# Patient Record
Sex: Female | Born: 1955 | Race: White | Hispanic: No | Marital: Married | Smoking: Never smoker
Health system: Southern US, Community
[De-identification: ages and names within clinical notes are randomized; demographics above are authoritative.]

## PROBLEM LIST (undated history)

## (undated) DIAGNOSIS — E039 Hypothyroidism, unspecified: Secondary | ICD-10-CM

## (undated) HISTORY — PX: CHOLECYSTECTOMY: SHX55

## (undated) HISTORY — DX: Hypothyroidism, unspecified: E03.9

---

## 1998-08-03 ENCOUNTER — Other Ambulatory Visit: Admission: RE | Admit: 1998-08-03 | Discharge: 1998-08-03 | Payer: Self-pay | Admitting: Obstetrics and Gynecology

## 1999-10-08 ENCOUNTER — Other Ambulatory Visit: Admission: RE | Admit: 1999-10-08 | Discharge: 1999-10-08 | Payer: Self-pay | Admitting: Obstetrics and Gynecology

## 2000-10-05 ENCOUNTER — Other Ambulatory Visit: Admission: RE | Admit: 2000-10-05 | Discharge: 2000-10-05 | Payer: Self-pay | Admitting: Obstetrics and Gynecology

## 2001-10-09 ENCOUNTER — Other Ambulatory Visit: Admission: RE | Admit: 2001-10-09 | Discharge: 2001-10-09 | Payer: Self-pay | Admitting: Obstetrics and Gynecology

## 2002-10-11 ENCOUNTER — Other Ambulatory Visit: Admission: RE | Admit: 2002-10-11 | Discharge: 2002-10-11 | Payer: Self-pay | Admitting: Obstetrics and Gynecology

## 2002-10-18 ENCOUNTER — Encounter: Payer: Self-pay | Admitting: Obstetrics and Gynecology

## 2002-10-18 ENCOUNTER — Encounter: Admission: RE | Admit: 2002-10-18 | Discharge: 2002-10-18 | Payer: Self-pay | Admitting: Obstetrics and Gynecology

## 2003-11-07 ENCOUNTER — Other Ambulatory Visit: Admission: RE | Admit: 2003-11-07 | Discharge: 2003-11-07 | Payer: Self-pay | Admitting: Obstetrics and Gynecology

## 2004-11-09 ENCOUNTER — Other Ambulatory Visit: Admission: RE | Admit: 2004-11-09 | Discharge: 2004-11-09 | Payer: Self-pay | Admitting: Obstetrics and Gynecology

## 2006-01-31 ENCOUNTER — Ambulatory Visit: Payer: Self-pay | Admitting: Internal Medicine

## 2015-03-09 ENCOUNTER — Telehealth: Payer: Self-pay | Admitting: Cardiology

## 2015-03-09 NOTE — Telephone Encounter (Signed)
Received records from [facility] for appointment with Dr Antoine PocheHochrein on 03/26/15.  [facility] for Dr Hochrein's schedule on 03/26/15, lp

## 2015-03-23 ENCOUNTER — Ambulatory Visit: Payer: Self-pay | Admitting: Cardiology

## 2015-03-26 ENCOUNTER — Encounter: Payer: Self-pay | Admitting: Cardiology

## 2015-03-26 ENCOUNTER — Ambulatory Visit (INDEPENDENT_AMBULATORY_CARE_PROVIDER_SITE_OTHER): Payer: BLUE CROSS/BLUE SHIELD | Admitting: Cardiology

## 2015-03-26 ENCOUNTER — Telehealth: Payer: Self-pay | Admitting: Cardiology

## 2015-03-26 VITALS — BP 126/84 | HR 80 | Ht 63.0 in | Wt 166.1 lb

## 2015-03-26 DIAGNOSIS — I4940 Unspecified premature depolarization: Secondary | ICD-10-CM | POA: Diagnosis not present

## 2015-03-26 DIAGNOSIS — R002 Palpitations: Secondary | ICD-10-CM

## 2015-03-26 NOTE — Patient Instructions (Signed)
Your physician recommends that you schedule a follow-up appointment in: As Needed  Your physician has recommended that you wear an event monitor. Event monitors are medical devices that record the heart's electrical activity. Doctors most often us these monitors to diagnose arrhythmias. Arrhythmias are problems with the speed or rhythm of the heartbeat. The monitor is a small, portable device. You can wear one while you do your normal daily activities. This is usually used to diagnose what is causing palpitations/syncope (passing out).  Merry Christmas and Happy New Year!!

## 2015-03-26 NOTE — Progress Notes (Signed)
Cardiology Office Note   Date:  03/26/2015   ID:  Cheryl Haverammie Ector, DOB 12/26/1955, MRN [id number]  PCP:  No primary care provider on file.  Cardiologist:   Rollene RotundaJames Malaia Buchta, MD  Referring:  Dr. Henderson Cloudomblin  Chief Complaint  Patient presents with  . Palpitations      History of Present Illness: Cheryl Moss is a 59 y.o. female who presents for evaluation of palpitations. She's had these have been happening for some time. It might happen 2 or 3 times per week. Feel a sensation of anxiety and breathing quickly. This happens at rest. She cannot bring it on. She does some walking at lunchtime at work and doesn't bring this on. She calms down passes spontaneously after several minutes. She's not had any presyncope or syncope. She's not describing any chest pressure, neck or arm discomfort. She otherwise doesn't have shortness of breath, PND or orthopnea. She occasionally gets some skipped isolated beats but these are rare and have been ongoing for years. She's never had any prior cardiac workup. She does have a family history of early onset coronary disease.  Past Medical History  Diagnosis Date  . Hypothyroid     Past Surgical History  Procedure Laterality Date  . Cholecystectomy       Current Outpatient Prescriptions  Medication Sig Dispense Refill  . levothyroxine (SYNTHROID, LEVOTHROID) 50 MCG tablet Take 50 mcg by mouth daily.  10   No current facility-administered medications for this visit.    Allergies:   Review of patient's allergies indicates not on file.    Social History:  The patient  reports that she has never smoked. She does not have any smokeless tobacco history on file.   Family History:  The patient's  family history includes Heart attack (age of onset: 559) in her father; Heart disease in her mother.    ROS:  Please see the history of present illness.   Otherwise, review of systems are positive for none.   All other systems are reviewed and negative.     PHYSICAL EXAM: VS:  BP 126/84 mmHg  Pulse 80  Ht 5\' 3"  (1.6 m)  Wt 166 lb 1.6 oz (75.342 kg)  BMI 29.43 kg/m2 , BMI Body mass index is 29.43 kg/(m^2). GENERAL:  Well appearing HEENT:  Pupils equal round and reactive, fundi not visualized, oral mucosa unremarkable NECK:  No jugular venous distention, waveform within normal limits, carotid upstroke brisk and symmetric, no bruits, no thyromegaly LYMPHATICS:  No cervical, inguinal adenopathy LUNGS:  Clear to auscultation bilaterally BACK:  No CVA tenderness CHEST:  Unremarkable HEART:  PMI not displaced or sustained,S1 and S2 within normal limits, no S3, no S4, no clicks, no rubs, no murmurs ABD:  Flat, positive bowel sounds normal in frequency in pitch, no bruits, no rebound, no guarding, no midline pulsatile mass, no hepatomegaly, no splenomegaly EXT:  2 plus pulses throughout, no edema, no cyanosis no clubbing SKIN:  No rashes no nodules NEURO:  Cranial nerves II through XII grossly intact, motor grossly intact throughout PSYCH:  Cognitively intact, oriented to person place and time    EKG:  EKG is ordered today. The ekg ordered today demonstrates  Sinus rhythm, rate 80, axis within normal limits, intervals within normal limits, no acute ST T-wave changes.   Recent Labs: No results found for requested labs within last 365 days.    Lipid Panel No results found for: CHOL, TRIG, HDL, CHOLHDL, VLDL, LDLCALC, LDLDIRECT    Wt Readings  from Last 3 Encounters:  03/26/15 166 lb 1.6 oz (75.342 kg)      Other studies Reviewed: Additional studies/ records that were reviewed today include: Office records from Dr. Henderson Cloud. Review of the above records demonstrates:  Please see elsewhere in the note.     ASSESSMENT AND PLAN:  PALPITATIONS:   The patient has symptoms that could be some tachycardia arrhythmia.  She will need monitoring.  Alishba Pharo will need a 21 day event monitor.  The patients symptoms necessitate an event  monitor.  The symptoms are too infrequent to be identified on a Holter monitor.    FAMILY HISTORY OF CAD:   We discussed risk reduction. Included in this would be the need for increased exercise.   I did review her lipids. Her LDL was 117 with an HDL of 61. She is not diabetic. There is no other indication for therapy her primary prevention.   Current medicines are reviewed at length with the patient today.  The patient does not have concerns regarding medicines.  The following changes have been made:  no change  Labs/ tests ordered today include:   Orders Placed This Encounter  Procedures  . Cardiac event monitor  . EKG 12-Lead     Disposition:   FU with me as needed.      Signed, Rollene Rotunda, MD  03/26/2015 5:52 PM    [facility]

## 2015-03-27 ENCOUNTER — Ambulatory Visit (INDEPENDENT_AMBULATORY_CARE_PROVIDER_SITE_OTHER): Payer: BLUE CROSS/BLUE SHIELD

## 2015-03-27 DIAGNOSIS — R002 Palpitations: Secondary | ICD-10-CM

## 2015-03-30 NOTE — Telephone Encounter (Signed)
Close encounter 

## 2015-04-24 ENCOUNTER — Telehealth: Payer: Self-pay | Admitting: Cardiology

## 2015-04-24 ENCOUNTER — Other Ambulatory Visit: Payer: Self-pay | Admitting: *Deleted

## 2015-04-24 MED ORDER — METOPROLOL TARTRATE 25 MG PO TABS: 25.0000 mg | ORAL_TABLET | Freq: Two times a day (BID) | ORAL | Status: DC

## 2015-04-24 NOTE — Telephone Encounter (Signed)
Spoke with pt who is aware of the results of monitor and to start Metoprolol 25 mg BID for atrial tachycardia.  rx sent into [facility] in [place] as requested.  She will call back with mail order pharmacy information.

## 2015-04-24 NOTE — Telephone Encounter (Signed)
Pt is returning your call

## 2015-04-24 NOTE — Telephone Encounter (Signed)
New problem   Pt returning your call concerning monitor results.

## 2015-04-24 NOTE — Telephone Encounter (Signed)
Lm to cb.

## 2015-04-28 ENCOUNTER — Telehealth: Payer: Self-pay | Admitting: Cardiology

## 2015-04-28 NOTE — Telephone Encounter (Signed)
LVMTCB Patient wants to know why he put her on metoprolol Reviewing notes it looks like she was started on this medication for palpitations not heart rate

## 2015-04-28 NOTE — Telephone Encounter (Signed)
New message     Talk to Dr Antoine Poche only. She want to know why he want her to take the metoprolol.  The nurse said her heart was beating fast. She has other questions the nurse could not answer.

## 2015-06-28 NOTE — Progress Notes (Signed)
Cardiology Office Note   Date:  06/29/2015   ID:  Cheryl Moss, DOB 09/17/1955, MRN [id number]  PCP:  No primary care provider on file.  Cardiologist:   Rollene RotundaJames Jamarii Banks, MD  Referring:  Dr. Henderson Cloudomblin  No chief complaint on file.     History of Present Illness: Cheryl Haverammie Vangorder is a 60 y.o. female who presents for evaluation of palpitations. I placed a Holter on her and she did have atrial tachycardia. Place on a low dose of beta blocker and she returns today to discuss this. She is doing much better.  The patient denies any new symptoms such as chest discomfort, neck or arm discomfort. There has been no new shortness of breath, PND or orthopnea. There have been no reported palpitations, presyncope or syncope.   Past Medical History  Diagnosis Date  . Hypothyroid     Past Surgical History  Procedure Laterality Date  . Cholecystectomy       Current Outpatient Prescriptions  Medication Sig Dispense Refill  . levothyroxine (SYNTHROID, LEVOTHROID) 50 MCG tablet Take 50 mcg by mouth daily.  10  . metoprolol tartrate (LOPRESSOR) 25 MG tablet Take 1 tablet (25 mg total) by mouth 2 (two) times daily. 60 tablet 11   No current facility-administered medications for this visit.    Allergies:   Review of patient's allergies indicates no known allergies.     ROS:  Please see the history of present illness.   Otherwise, review of systems are positive for none.   All other systems are reviewed and negative.    PHYSICAL EXAM: VS:  BP 118/78 mmHg  Pulse 78  Ht 5\' 3"  (1.6 m)  Wt 165 lb 4.8 oz (74.98 kg)  BMI 29.29 kg/m2 , BMI Body mass index is 29.29 kg/(m^2). GENERAL:  Well appearing NECK:  No jugular venous distention, waveform within normal limits, carotid upstroke brisk and symmetric, no bruits, no thyromegaly LUNGS:  Clear to auscultation bilaterally BACK:  No CVA tenderness CHEST:  Unremarkable HEART:  PMI not displaced or sustained,S1 and S2 within normal limits, no S3, no S4,  no clicks, no rubs, no murmurs ABD:  Flat, positive bowel sounds normal in frequency in pitch, no bruits, no rebound, no guarding, no midline pulsatile mass, no hepatomegaly, no splenomegaly EXT:  2 plus pulses throughout, no edema, no cyanosis no clubbing     EKG:  EKG is not ordered today.    Recent Labs: No results found for requested labs within last 365 days.    Lipid Panel No results found for: CHOL, TRIG, HDL, CHOLHDL, VLDL, LDLCALC, LDLDIRECT    Wt Readings from Last 3 Encounters:  06/29/15 165 lb 4.8 oz (74.98 kg)  03/26/15 166 lb 1.6 oz (75.342 kg)      Other studies Reviewed: Additional studies/ records that were reviewed today include: Office records from Dr. Henderson Cloudomblin. Review of the above records demonstrates:  Please see elsewhere in the note.     ASSESSMENT AND PLAN:  PALPITATIONS:   Her beta blocker is working well. No change in therapy is indicated.   Current medicines are reviewed at length with the patient today.  The patient does not have concerns regarding medicines.  The following changes have been made:  no change  Labs/ tests ordered today include:   No orders of the defined types were placed in this encounter.     Disposition:   FU with me as needed.      Signed, Rollene RotundaJames Sakari Raisanen, MD  06/29/2015 8:14 AM    [facility]

## 2015-06-29 ENCOUNTER — Encounter: Payer: Self-pay | Admitting: Cardiology

## 2015-06-29 ENCOUNTER — Ambulatory Visit (INDEPENDENT_AMBULATORY_CARE_PROVIDER_SITE_OTHER): Payer: BLUE CROSS/BLUE SHIELD | Admitting: Cardiology

## 2015-06-29 VITALS — BP 118/78 | HR 78 | Ht 63.0 in | Wt 165.3 lb

## 2015-06-29 DIAGNOSIS — R002 Palpitations: Secondary | ICD-10-CM

## 2015-06-29 NOTE — Patient Instructions (Signed)
Your physician wants you to follow-up in: 1 Year. You will receive a reminder letter in the mail two months in advance. If you don't receive a letter, please call our office to schedule the follow-up appointment.  

## 2015-07-28 DIAGNOSIS — Z713 Dietary counseling and surveillance: Secondary | ICD-10-CM | POA: Diagnosis not present

## 2015-09-08 DIAGNOSIS — L820 Inflamed seborrheic keratosis: Secondary | ICD-10-CM | POA: Diagnosis not present

## 2015-09-08 DIAGNOSIS — L570 Actinic keratosis: Secondary | ICD-10-CM | POA: Diagnosis not present

## 2015-11-30 DIAGNOSIS — Z0000 Encounter for general adult medical examination without abnormal findings: Secondary | ICD-10-CM | POA: Diagnosis not present

## 2015-12-25 DIAGNOSIS — Z713 Dietary counseling and surveillance: Secondary | ICD-10-CM | POA: Diagnosis not present

## 2016-02-02 DIAGNOSIS — Z713 Dietary counseling and surveillance: Secondary | ICD-10-CM | POA: Diagnosis not present

## 2016-02-11 DIAGNOSIS — Z1211 Encounter for screening for malignant neoplasm of colon: Secondary | ICD-10-CM | POA: Diagnosis not present

## 2016-02-11 DIAGNOSIS — Z86010 Personal history of colonic polyps: Secondary | ICD-10-CM | POA: Diagnosis not present

## 2016-02-11 DIAGNOSIS — E669 Obesity, unspecified: Secondary | ICD-10-CM | POA: Diagnosis not present

## 2016-03-07 DIAGNOSIS — Z131 Encounter for screening for diabetes mellitus: Secondary | ICD-10-CM | POA: Diagnosis not present

## 2016-03-07 DIAGNOSIS — Z6830 Body mass index (BMI) 30.0-30.9, adult: Secondary | ICD-10-CM | POA: Diagnosis not present

## 2016-03-07 DIAGNOSIS — Z1329 Encounter for screening for other suspected endocrine disorder: Secondary | ICD-10-CM | POA: Diagnosis not present

## 2016-03-07 DIAGNOSIS — Z1231 Encounter for screening mammogram for malignant neoplasm of breast: Secondary | ICD-10-CM | POA: Diagnosis not present

## 2016-03-07 DIAGNOSIS — Z01419 Encounter for gynecological examination (general) (routine) without abnormal findings: Secondary | ICD-10-CM | POA: Diagnosis not present

## 2016-03-09 DIAGNOSIS — J029 Acute pharyngitis, unspecified: Secondary | ICD-10-CM | POA: Diagnosis not present

## 2016-03-09 DIAGNOSIS — J069 Acute upper respiratory infection, unspecified: Secondary | ICD-10-CM | POA: Diagnosis not present

## 2016-03-19 ENCOUNTER — Other Ambulatory Visit: Payer: Self-pay | Admitting: Cardiology

## 2016-03-21 NOTE — Telephone Encounter (Signed)
Rx(s) sent to pharmacy electronically.  

## 2016-04-18 DIAGNOSIS — K635 Polyp of colon: Secondary | ICD-10-CM | POA: Diagnosis not present

## 2016-04-18 DIAGNOSIS — K5730 Diverticulosis of large intestine without perforation or abscess without bleeding: Secondary | ICD-10-CM | POA: Diagnosis not present

## 2016-04-18 DIAGNOSIS — D120 Benign neoplasm of cecum: Secondary | ICD-10-CM | POA: Diagnosis not present

## 2016-04-18 DIAGNOSIS — Z1211 Encounter for screening for malignant neoplasm of colon: Secondary | ICD-10-CM | POA: Diagnosis not present

## 2016-04-26 ENCOUNTER — Other Ambulatory Visit: Payer: Self-pay

## 2016-04-26 MED ORDER — METOPROLOL TARTRATE 25 MG PO TABS: 25.0000 mg | ORAL_TABLET | Freq: Two times a day (BID) | ORAL | 2 refills | Status: DC

## 2016-05-04 DIAGNOSIS — J0100 Acute maxillary sinusitis, unspecified: Secondary | ICD-10-CM | POA: Diagnosis not present

## 2016-07-21 DIAGNOSIS — M25571 Pain in right ankle and joints of right foot: Secondary | ICD-10-CM | POA: Diagnosis not present

## 2016-08-10 ENCOUNTER — Encounter: Payer: Self-pay | Admitting: Allergy & Immunology

## 2016-08-10 ENCOUNTER — Ambulatory Visit (INDEPENDENT_AMBULATORY_CARE_PROVIDER_SITE_OTHER): Payer: BLUE CROSS/BLUE SHIELD | Admitting: Allergy & Immunology

## 2016-08-10 VITALS — BP 138/84 | HR 88 | Temp 98.3°F | Resp 16 | Ht 63.5 in | Wt 176.2 lb

## 2016-08-10 DIAGNOSIS — H6593 Unspecified nonsuppurative otitis media, bilateral: Secondary | ICD-10-CM

## 2016-08-10 DIAGNOSIS — J310 Chronic rhinitis: Secondary | ICD-10-CM | POA: Diagnosis not present

## 2016-08-10 MED ORDER — AZELASTINE HCL 0.15 % NA SOLN: 2.0000 | Freq: Every day | NASAL | 5 refills | Status: DC

## 2016-08-10 NOTE — Patient Instructions (Addendum)
1. Chronic allergic rhinitis - Testing showed: grasses, weeds, trees, molds, cockroach - Avoidance measures provided. - Start a nasal steroid two sprays per nostril twice daily for one week and then decrease to two sprays per nostril daily thereafter. - Add azelastine nasal spray two sprays per nostril daily.  - The addition of the nasal sprays should help with your symptoms.  - Continue with an antihistamine daily.  2. OME (otitis media with effusion), bilateral - I think that the fluid in your ears is from Eustachian tube drainage problems due to your enlarged nasal tissue. - Hopefully controlling your rhinitis symptoms with help with clearance of the fluid.  3. Return in about 2 months (around 10/10/2016).  Please inform us of any Emergency Department visits, hospitalizations, or changes in symptoms. Call us before going to the ED for breathing or allergy symptoms since we might be able to fit you in for a sick visit. Feel free to contact us anytime with any questions, problems, or concerns.  It was a pleasure to meet you today! Happy spring!   Websites that have reliable patient information: 1. American Academy of Asthma, Allergy, and Immunology: www.aaaai.org 2. Food Allergy Research and Education (FARE): foodallergy.org 3. Mothers of Asthmatics: http://www.asthmacommunitynetwork.org 4. American College of Allergy, Asthma, and Immunology: www.acaai.org  Reducing Pollen Exposure  The American Academy of Allergy, Asthma and Immunology suggests the following steps to reduce your exposure to pollen during allergy seasons.    1. Do not hang sheets or clothing out to dry; pollen may collect on these items. 2. Do not mow lawns or spend time around freshly cut grass; mowing stirs up pollen. 3. Keep windows closed at night.  Keep car windows closed while driving. 4. Minimize morning activities outdoors, a time when pollen counts are usually at their highest. 5. Stay indoors as much as  possible when pollen counts or humidity is high and on windy days when pollen tends to remain in the air longer. 6. Use air conditioning when possible.  Many air conditioners have filters that trap the pollen spores. 7. Use a HEPA room air filter to remove pollen form the indoor air you breathe.  Control of Mold Allergen  Mold and fungi can grow on a variety of surfaces provided certain temperature and moisture conditions exist.  Outdoor molds grow on plants, decaying vegetation and soil.  The major outdoor mold, Alternaria and Cladosporium, are found in very high numbers during hot and dry conditions.  Generally, a late Summer - Fall peak is seen for common outdoor fungal spores.  Rain will temporarily lower outdoor mold spore count, but counts rise rapidly when the rainy period ends.  The most important indoor molds are Aspergillus and Penicillium.  Dark, humid and poorly ventilated basements are ideal sites for mold growth.  The next most common sites of mold growth are the bathroom and the kitchen.  Outdoor [employer] 1. Use air conditioning and keep windows closed 2. Avoid exposure to decaying vegetation. 3. Avoid leaf raking. 4. Avoid grain handling. 5. Consider wearing a face mask if working in moldy areas.  Indoor Mold Control 1. Maintain humidity below 50%. 2. Clean washable surfaces with 5% bleach solution. 3. Remove sources e.g. contaminated carpets.  Control of Cockroach Allergen  Cockroach allergen has been identified as an important cause of acute attacks of asthma, especially in urban settings.  There are fifty-five species of cockroach that exist in the [place], however only three, the [place], [place] and [place]  species produce allergen that can affect patients with Asthma.  Allergens can be obtained from fecal particles, egg casings and secretions from cockroaches.    1. Remove food sources. 2. Reduce access to water. 3. Seal access and entry points. 4. Spray  runways with 0.5-1% Diazinon or Chlorpyrifos 5. Blow boric acid power under stoves and refrigerator. 6. Place bait stations (hydramethylnon) at feeding sites.

## 2016-08-10 NOTE — Progress Notes (Signed)
NEW PATIENT  Date of Service/Encounter:  08/10/16  Referring provider: No primary care provider on file.   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 Chronic allergic rhinitis - Testing showed: grasses, weeds, trees, molds, cockroach - Avoidance measures provided. - Start a nasal steroid two sprays per nostril twice daily for one week and then decrease to two sprays per nostril daily thereafter. - Add azelastine nasal spray two sprays per nostril daily.  - The addition of the nasal sprays should help with your symptoms.  - Continue with an antihistamine daily. - It should be noted that Cheryl Moss is on a beta blocker, which can blunt the action of epinephrine. - Therefore, it is a relative contraindication when considering allergen immunotherapy. - If we decide to advanced allergen immunotherapy, we would likely discuss changing her medication away from a beta blocker. - Visit be done in conjunction with her cardiologist, Dr. Rollene Rotunda.   2. OME (otitis media with effusion), bilateral - I think that the fluid in your ears is from Eustachian tube drainage problems due to your enlarged nasal tissue. - Hopefully controlling your rhinitis symptoms with help with clearance of the fluid.  3. Rash - There is no rash present today, therefore it was difficult to evaluate. - I did ask her to take pictures when the rash returned. - As it only occurs along clothing lines, this could be a manifestation of allergic contact dermatitis versus pressure-induced urticaria. - However the rash does not cause any itching or pain, which confuses the picture.  4. Return in about 2 months (around 10/10/2016).   Subjective:   Cheryl Moss is a 61 y.o. female presenting today for  evaluation of  Chief Complaint  Patient presents with  . Nasal Congestion  . Cough    Cheryl Moss has a history of the following: Patient Active Problem List   Diagnosis Date Noted  . Palpitations 03/26/2015    History obtained from: chart review and patient.  Cheryl Moss was referred by No primary care provider on file.Cheryl Moss is a 61 y.o. female presenting for an evaluation of allergies. She reports that her symptoms have worsened as she has gotten older. She was taking cetirizine  daily for a few years but changed to loratadine  this year. This has not helped much at all. She was on Allegra at one point but his was about the same She has not been on any nose sprays whatsoever. Her typically symptoms are sinus pressure and congestion. She is typically not treated with antibiotics. She does endorse watery eyes. She does have postnasal drip and throat clearing. Her symptoms seemed to get worse in the spring and the fall, but now she has the same symptoms throughout the year. Animals do not seem to bother her. She was allergy tested at our practice 20 years ago.   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 Interestingly, this does not itch. She will have some in the winter but there is definitely an increased frequency in the summer time. At one point she was treated with clotrimazole with transient improvement in symptoms.  Otherwise, there is no history  of other atopic diseases, including drug allergies, food allergies, stinging insect allergies, or urticaria. There is no significant infectious history. Vaccinations are up to date. She is on metoprolol for palpitations, which has provided relief.    Past Medical History: Patient Active Problem List   Diagnosis Date Noted  . Palpitations 03/26/2015    Medication List:  Allergies  as of 08/10/2016   No Known Allergies     Medication List       Accurate as of 08/10/16  3:14 PM. Always use your most recent med list.          levothyroxine 50 MCG tablet Commonly known as:  SYNTHROID, LEVOTHROID Take 50 mcg by mouth daily.   loratadine 10 MG tablet Commonly known as:  CLARITIN Take 10 mg by mouth daily.   metoprolol tartrate 25 MG tablet Commonly known as:  LOPRESSOR Take 1 tablet (25 mg total) by mouth 2 (two) times daily.       Birth History: non-contributory.    Developmental History: non-contributory.   Past Surgical History: Past Surgical History:  Procedure Laterality Date  . CHOLECYSTECTOMY    . CHOLECYSTECTOMY       Family History: Family History  Problem Relation Age of Onset  . Heart disease Mother     Pacemaker  . Heart attack Father 54    Died age 30  . Allergic rhinitis Neg Hx   . Angioedema Neg Hx   . Asthma Neg Hx   . Eczema Neg Hx      Social History: Gwenivere lives at home with her husband. They have one 38yo, one 39yo, and one 60yo. There are 8 grandchildren. Lisbet lives in a 55 year old home. There is carpeting throughout the home. They have electric heating and central cooling. There are no animals inside or outside at home. There are no dust mite covers on the bedding. There is no tobacco exposure. 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     Review of Systems: a 14-point review of systems is pertinent for what is mentioned in HPI.  Otherwise, all other systems were negative. Constitutional: negative other than that listed in the HPI Eyes: negative other than that listed in the HPI Ears, nose, mouth, throat, and face: negative other than that listed in the HPI Respiratory: negative other than that listed in the HPI Cardiovascular: negative other than that listed in the HPI Gastrointestinal: negative other than that listed in the HPI Genitourinary:  negative other than that listed in the HPI Integument: negative other than that listed in the HPI Hematologic: negative other than that listed in the HPI Musculoskeletal: negative other than that listed in the HPI Neurological: negative other than that listed in the HPI Allergy/Immunologic: negative other than that listed in the HPI    Objective:   Blood pressure 138/84, pulse 88, temperature 98.3 F (36.8 C), temperature source Oral, resp. rate 16, height 5' 3.5" (1.613 m), weight 176 lb 3.2 oz (79.9 kg). Body mass index is 30.72 kg/m.   Physical Exam:  General: Alert, interactive, in no acute distress. Pleasant. Gray curly hair.  Eyes: No conjunctival injection present on the right, No conjunctival injection present on the left, PERRL bilaterally, No discharge on the right, No discharge on the left and No Horner-Trantas dots present Ears: Right OME, Left OME, Right TM intact without perforation and Left TM intact without perforation.  Nose/Throat: External nose within normal limits, nasal crease  present and septum midline, turbinates markedly edematous and pale with clear discharge, post-pharynx erythematous with cobblestoning in the posterior oropharynx. Tonsils 2+ without exudates Neck: Supple without thyromegaly.  Adenopathy: no enlarged lymph nodes appreciated in the anterior cervical, occipital, axillary, epitrochlear, inguinal, or popliteal regions Lungs: Clear to auscultation without wheezing, rhonchi or rales. No increased work of breathing. CV: Normal S1/S2, no murmurs. Capillary refill <2 seconds.  Abdomen: Nondistended, nontender. No guarding or rebound tenderness. Bowel sounds present in all fields and hyperactive  Skin: Warm and dry, without lesions or rashes. Extremities:  No clubbing, cyanosis or edema. Neuro:   Grossly intact. No focal deficits appreciated. Responsive to questions.  Diagnostic studies:   Allergy Studies:   Indoor/Outdoor Percutaneous Adult  Environmental Panel: negative to the entire panel with adequate controls.  Indoor/Outdoor Selected Intradermal Environmental Panel: positive to [employer], Grass mix, ragweed mix, weed mix, tree mix, mold mix #3 and cockroach. Otherwise negative with adequate controls.   Malachi Bonds, MD FAAAAI [facility]

## 2016-08-15 NOTE — Progress Notes (Signed)
Cardiology Office Note   Date:  08/17/2016   ID:  Cheryl Moss, DOB 09/20/1955, MRN [id number]  PCP:  Joycelyn RuaMeyers, Stephen, MD  Cardiologist:   Rollene RotundaJames Kimberley Dastrup, MD  Referring:  Dr. Henderson Cloudomblin  Chief Complaint  Patient presents with  . Palpitations      History of Present Illness: Cheryl Moss is a 61 y.o. female who presents for evaluation of palpitations. I placed a Holter on her and she did have atrial tachycardia.  She was pllaced on a low dose of beta blocker and did well.  She returns for follow up.  She has been doing well.  She rarely gets palpitations.  The patient denies any new symptoms such as chest discomfort, neck or arm discomfort. There has been no new shortness of breath, PND or orthopnea. There has been no reported presyncope or syncope.  Past Medical History:  Diagnosis Date  . Hypothyroid     Past Surgical History:  Procedure Laterality Date  . CHOLECYSTECTOMY       Current Outpatient Prescriptions  Medication Sig Dispense Refill  . Azelastine HCl 0.15 % SOLN Place 2 sprays into the nose daily. 30 mL 5  . levothyroxine (SYNTHROID, LEVOTHROID) 50 MCG tablet Take 50 mcg by mouth daily.  10  . loratadine (CLARITIN) 10 MG tablet Take 10 mg by mouth daily.    . metoprolol tartrate (LOPRESSOR) 25 MG tablet Take 1 tablet (25 mg total) by mouth 2 (two) times daily. 180 tablet 2   No current facility-administered medications for this visit.     Allergies:   Patient has no known allergies.    ROS:  Please see the history of present illness.   Otherwise, review of systems are positive for none.   All other systems are reviewed and negative.    PHYSICAL EXAM: VS:  BP 114/82   Pulse 72   Ht 5\' 3"  (1.6 m)   Wt 174 lb (78.9 kg)   BMI 30.82 kg/m  , BMI Body mass index is 30.82 kg/m. GENERAL:  Well appearing NECK:  No jugular venous distention, waveform within normal limits, carotid upstroke brisk and symmetric, no bruits, no thyromegaly LUNGS:  Clear to  auscultation bilaterally HEART:  PMI not displaced or sustained,S1 and S2 within normal limits, no S3, no S4, no clicks, no rubs, no murmurs ABD:  Flat, positive bowel sounds normal in frequency in pitch, no bruits, no rebound, no guarding, no midline pulsatile mass, no hepatomegaly, no splenomegaly EXT:  2 plus pulses throughout, no edema, no cyanosis no clubbing   EKG:  EKG is not  ordered today.   Recent Labs: No results found for requested labs within last 8760 hours.    Lipid Panel No results found for: CHOL, TRIG, HDL, CHOLHDL, VLDL, LDLCALC, LDLDIRECT    Wt Readings from Last 3 Encounters:  08/17/16 174 lb (78.9 kg)  08/10/16 176 lb 3.2 oz (79.9 kg)  06/29/15 165 lb 4.8 oz (75 kg)      Other studies Reviewed: Additional studies/ records that were reviewed today include:  None Review of the above records demonstrates:  None   ASSESSMENT AND PLAN:  PALPITATIONS:    Her beta blocker is working well. She does not need further study or change in meds.     Current medicines are reviewed at length with the patient today.  The patient does not have concerns regarding medicines.  The following changes have been made:  None  Labs/ tests ordered today include: None  No orders of the defined types were placed in this encounter.    Disposition:   FU with me as needed. Forest Becker, MD  08/17/2016 10:26 PM    [facility]

## 2016-08-17 ENCOUNTER — Encounter: Payer: Self-pay | Admitting: Cardiology

## 2016-08-17 ENCOUNTER — Ambulatory Visit (INDEPENDENT_AMBULATORY_CARE_PROVIDER_SITE_OTHER): Payer: BLUE CROSS/BLUE SHIELD | Admitting: Cardiology

## 2016-08-17 VITALS — BP 114/82 | HR 72 | Ht 63.0 in | Wt 174.0 lb

## 2016-08-17 DIAGNOSIS — R002 Palpitations: Secondary | ICD-10-CM

## 2016-08-17 NOTE — Patient Instructions (Signed)
Medication Instructions:  Continue current medications  Labwork: None Ordered  Testing/Procedures: None Ordered  Follow-Up: Your physician recommends that you schedule a follow-up appointment in: As Needed   Any Other Special Instructions Will Be Listed Below (If Applicable).   If you need a refill on your cardiac medications before your next appointment, please call your pharmacy.   

## 2016-09-13 DIAGNOSIS — L570 Actinic keratosis: Secondary | ICD-10-CM | POA: Diagnosis not present

## 2017-01-16 DIAGNOSIS — L820 Inflamed seborrheic keratosis: Secondary | ICD-10-CM | POA: Diagnosis not present

## 2017-01-16 DIAGNOSIS — L570 Actinic keratosis: Secondary | ICD-10-CM | POA: Diagnosis not present

## 2017-01-30 DIAGNOSIS — J209 Acute bronchitis, unspecified: Secondary | ICD-10-CM | POA: Diagnosis not present

## 2017-02-15 ENCOUNTER — Other Ambulatory Visit: Payer: Self-pay | Admitting: Cardiology

## 2017-02-15 NOTE — Telephone Encounter (Signed)
Rx has been sent to the pharmacy electronically. ° °

## 2017-03-21 DIAGNOSIS — Z1231 Encounter for screening mammogram for malignant neoplasm of breast: Secondary | ICD-10-CM | POA: Diagnosis not present

## 2017-03-21 DIAGNOSIS — Z6830 Body mass index (BMI) 30.0-30.9, adult: Secondary | ICD-10-CM | POA: Diagnosis not present

## 2017-03-21 DIAGNOSIS — Z01419 Encounter for gynecological examination (general) (routine) without abnormal findings: Secondary | ICD-10-CM | POA: Diagnosis not present

## 2017-03-21 DIAGNOSIS — E039 Hypothyroidism, unspecified: Secondary | ICD-10-CM | POA: Diagnosis not present

## 2017-03-24 ENCOUNTER — Other Ambulatory Visit: Payer: Self-pay | Admitting: Obstetrics and Gynecology

## 2017-03-24 DIAGNOSIS — R928 Other abnormal and inconclusive findings on diagnostic imaging of breast: Secondary | ICD-10-CM

## 2017-03-27 ENCOUNTER — Ambulatory Visit
Admission: RE | Admit: 2017-03-27 | Discharge: 2017-03-27 | Disposition: A | Discharge status: Home / Self Care | Payer: BLUE CROSS/BLUE SHIELD | Source: Ambulatory Visit | Attending: Obstetrics and Gynecology | Admitting: Obstetrics and Gynecology

## 2017-03-27 DIAGNOSIS — R928 Other abnormal and inconclusive findings on diagnostic imaging of breast: Secondary | ICD-10-CM | POA: Diagnosis not present

## 2017-03-27 DIAGNOSIS — N6001 Solitary cyst of right breast: Secondary | ICD-10-CM | POA: Diagnosis not present

## 2017-04-05 DIAGNOSIS — M545 Low back pain: Secondary | ICD-10-CM | POA: Diagnosis not present

## 2017-04-24 DIAGNOSIS — Z0000 Encounter for general adult medical examination without abnormal findings: Secondary | ICD-10-CM | POA: Diagnosis not present

## 2017-06-23 DIAGNOSIS — Z9109 Other allergy status, other than to drugs and biological substances: Secondary | ICD-10-CM | POA: Diagnosis not present

## 2017-06-23 DIAGNOSIS — R7301 Impaired fasting glucose: Secondary | ICD-10-CM | POA: Diagnosis not present

## 2017-07-20 DIAGNOSIS — J069 Acute upper respiratory infection, unspecified: Secondary | ICD-10-CM | POA: Diagnosis not present

## 2017-07-20 DIAGNOSIS — Z9109 Other allergy status, other than to drugs and biological substances: Secondary | ICD-10-CM | POA: Diagnosis not present

## 2017-08-06 ENCOUNTER — Other Ambulatory Visit: Payer: Self-pay | Admitting: Cardiology

## 2017-08-07 NOTE — Telephone Encounter (Signed)
REFILL 

## 2017-09-21 DIAGNOSIS — L718 Other rosacea: Secondary | ICD-10-CM | POA: Diagnosis not present

## 2017-09-21 DIAGNOSIS — B360 Pityriasis versicolor: Secondary | ICD-10-CM | POA: Diagnosis not present

## 2017-09-21 DIAGNOSIS — L821 Other seborrheic keratosis: Secondary | ICD-10-CM | POA: Diagnosis not present

## 2017-09-21 DIAGNOSIS — D369 Benign neoplasm, unspecified site: Secondary | ICD-10-CM | POA: Diagnosis not present

## 2017-10-18 DIAGNOSIS — J3089 Other allergic rhinitis: Secondary | ICD-10-CM | POA: Diagnosis not present

## 2017-11-14 ENCOUNTER — Other Ambulatory Visit: Payer: Self-pay | Admitting: Cardiology

## 2017-11-30 DIAGNOSIS — M79644 Pain in right finger(s): Secondary | ICD-10-CM | POA: Diagnosis not present

## 2017-11-30 DIAGNOSIS — M25531 Pain in right wrist: Secondary | ICD-10-CM | POA: Diagnosis not present

## 2017-12-12 DIAGNOSIS — M25531 Pain in right wrist: Secondary | ICD-10-CM | POA: Diagnosis not present

## 2017-12-16 ENCOUNTER — Other Ambulatory Visit: Payer: Self-pay | Admitting: Cardiology

## 2018-01-16 ENCOUNTER — Other Ambulatory Visit: Payer: Self-pay | Admitting: Cardiology

## 2018-01-18 DIAGNOSIS — J3089 Other allergic rhinitis: Secondary | ICD-10-CM | POA: Diagnosis not present

## 2018-02-13 ENCOUNTER — Other Ambulatory Visit: Payer: Self-pay | Admitting: Cardiology

## 2018-03-06 DIAGNOSIS — N8189 Other female genital prolapse: Secondary | ICD-10-CM | POA: Diagnosis not present

## 2018-03-06 DIAGNOSIS — Z01411 Encounter for gynecological examination (general) (routine) with abnormal findings: Secondary | ICD-10-CM | POA: Diagnosis not present

## 2018-03-06 DIAGNOSIS — Z1231 Encounter for screening mammogram for malignant neoplasm of breast: Secondary | ICD-10-CM | POA: Diagnosis not present

## 2018-03-06 DIAGNOSIS — Z6830 Body mass index (BMI) 30.0-30.9, adult: Secondary | ICD-10-CM | POA: Diagnosis not present

## 2018-03-06 DIAGNOSIS — E039 Hypothyroidism, unspecified: Secondary | ICD-10-CM | POA: Diagnosis not present

## 2018-03-06 DIAGNOSIS — Z01419 Encounter for gynecological examination (general) (routine) without abnormal findings: Secondary | ICD-10-CM | POA: Diagnosis not present

## 2018-03-08 ENCOUNTER — Other Ambulatory Visit: Payer: Self-pay | Admitting: Cardiology

## 2018-04-26 DIAGNOSIS — Z9109 Other allergy status, other than to drugs and biological substances: Secondary | ICD-10-CM | POA: Diagnosis not present

## 2018-05-02 IMAGING — MG 2D DIGITAL DIAGNOSTIC UNILATERAL RIGHT MAMMOGRAM WITH CAD AND AD
6 of 9 series · 6 of 21 positions shown · non-contrast
Comparison: March 21, 2017

CLINICAL DATA: 61-year-old patient recalled from recent 2D
screening mammogram for evaluation of a possible asymmetry in the
right breast.

EXAM:
2D DIGITAL DIAGNOSTIC RIGHT MAMMOGRAM WITH CAD AND ADJUNCT TOMO
ULTRASOUND RIGHT BREAST

[R MLO synth-2D]
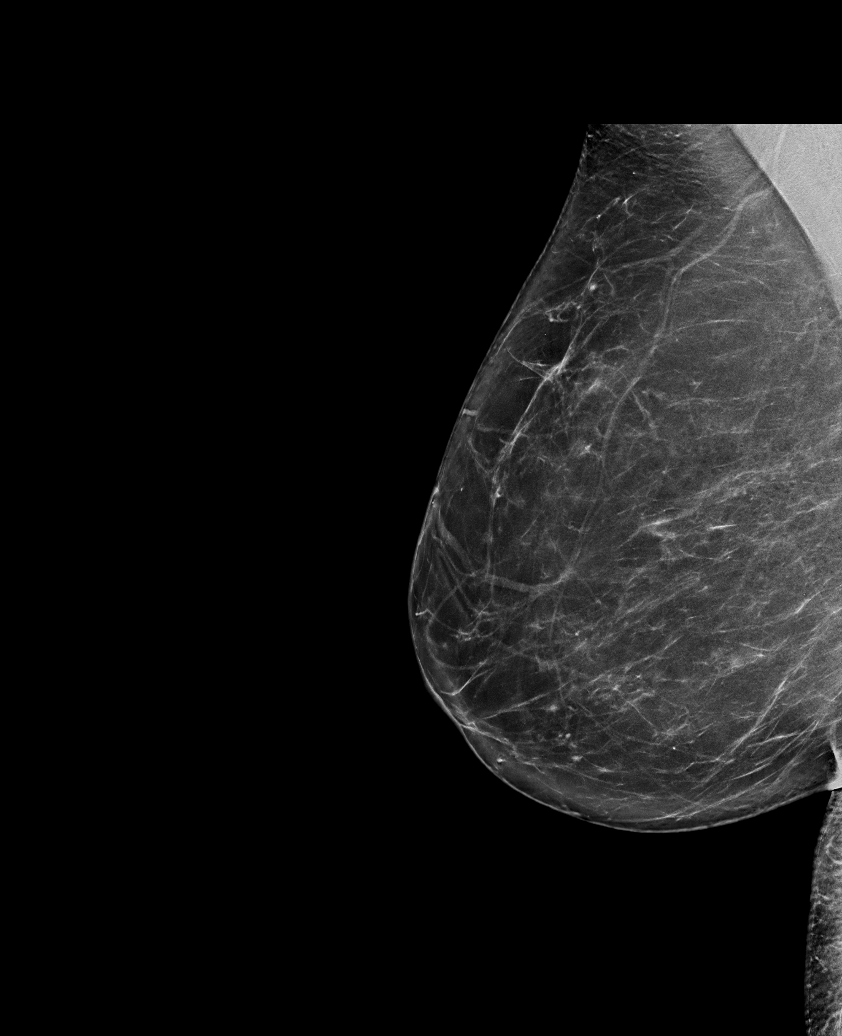

[R CC synth-2D (1 of 2)]
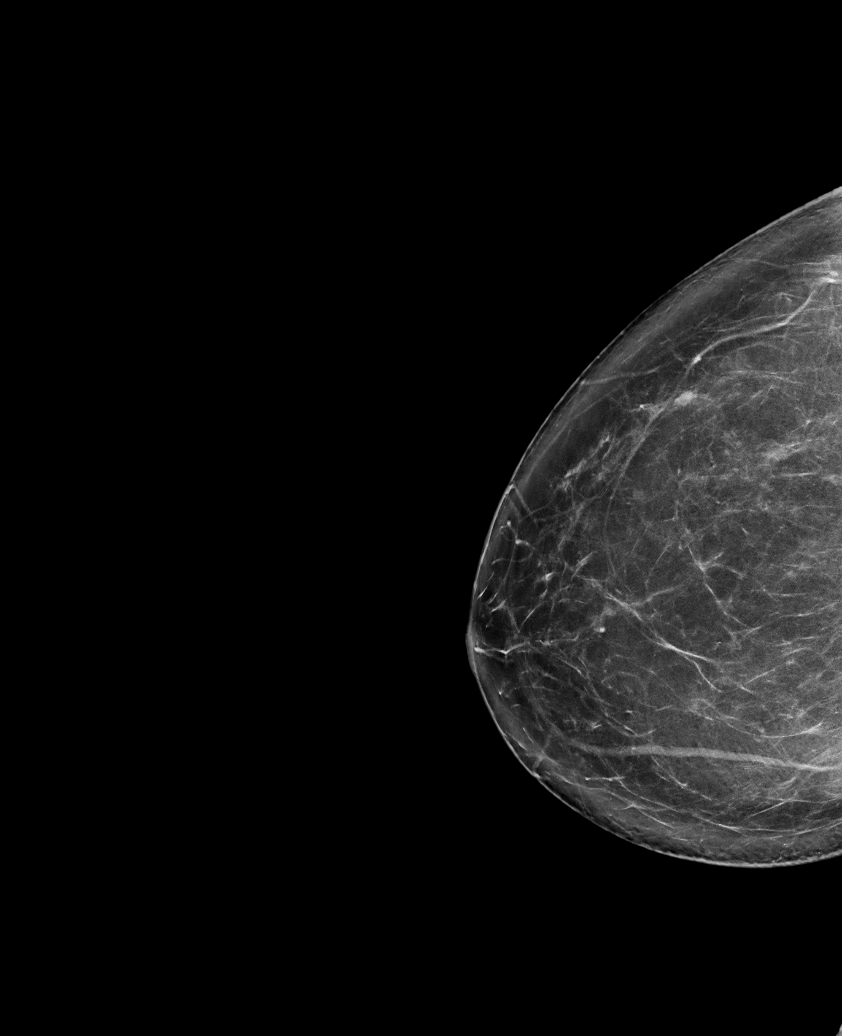

[R MLO]
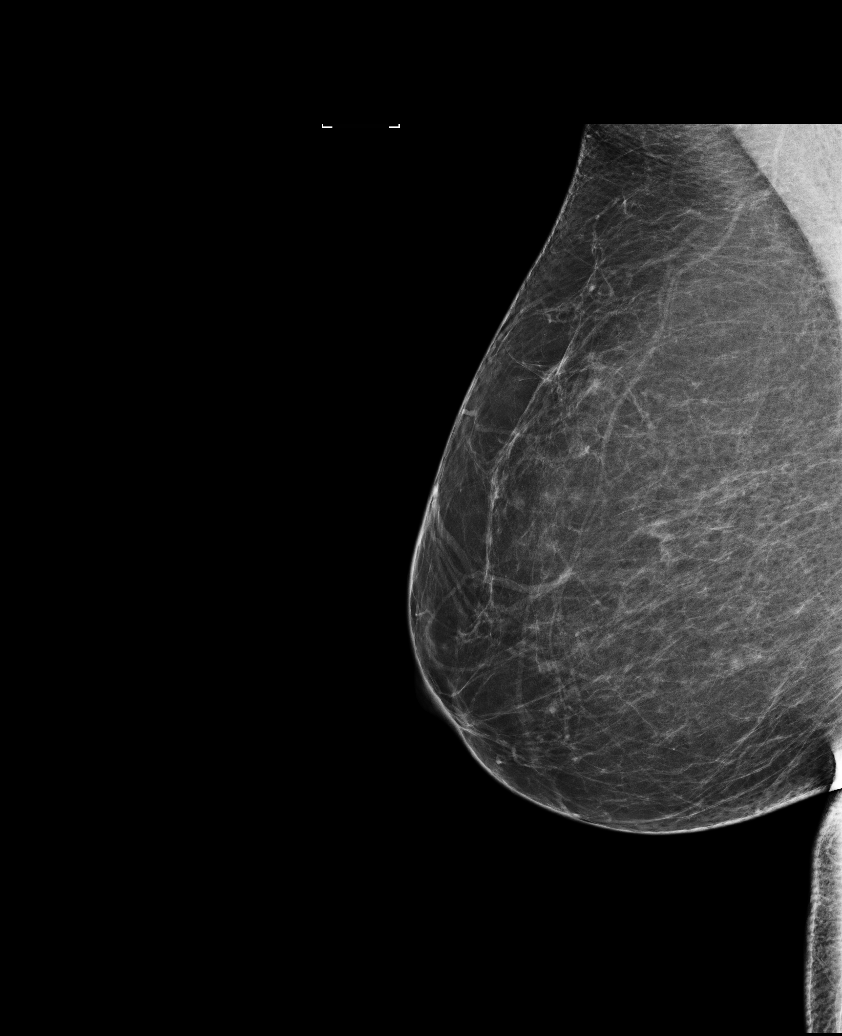

[R CC synth-2D (2 of 2)]
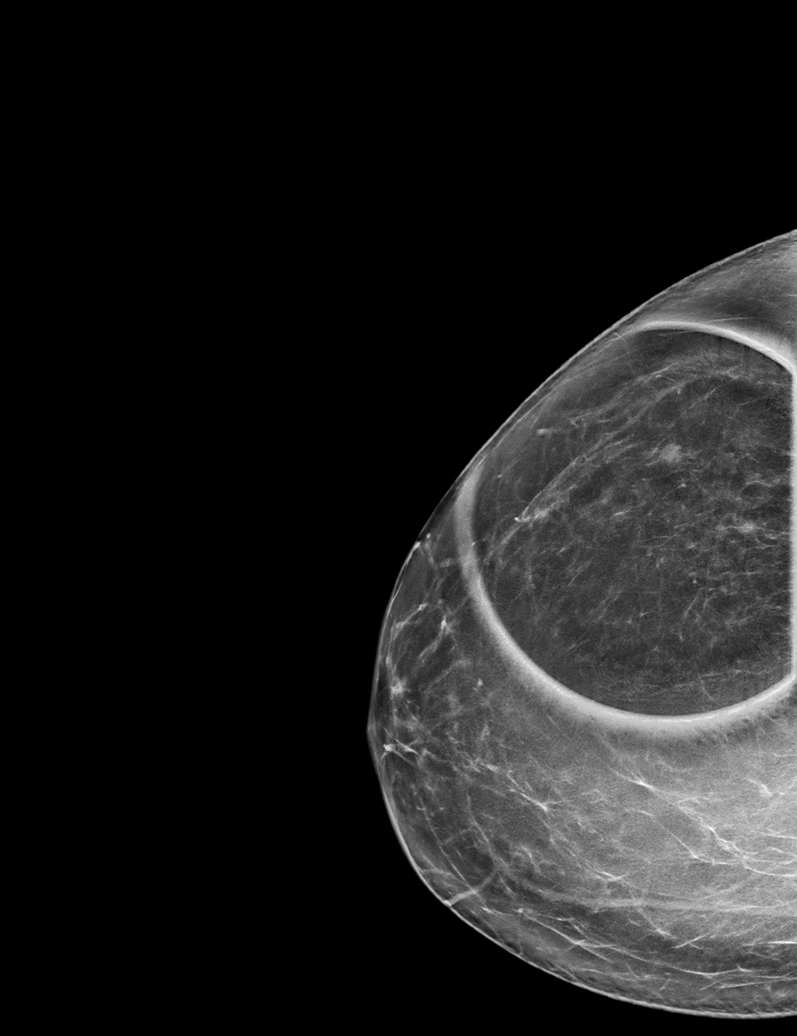

[R CC (1 of 2)]
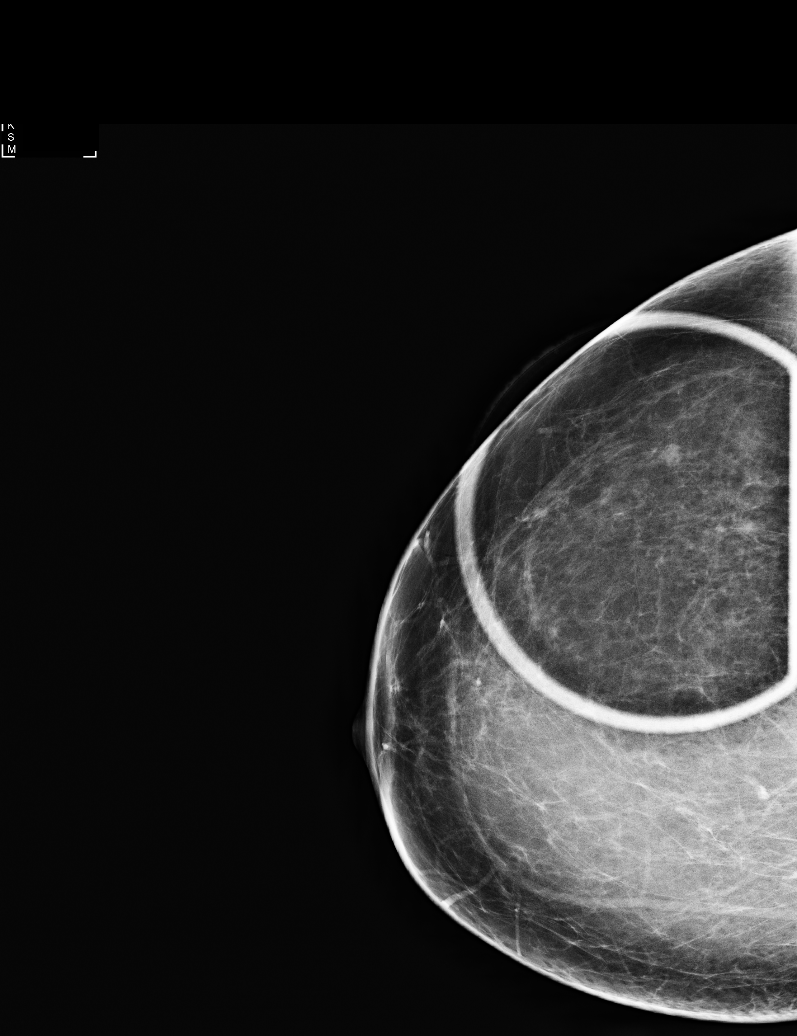

[R CC (2 of 2)]
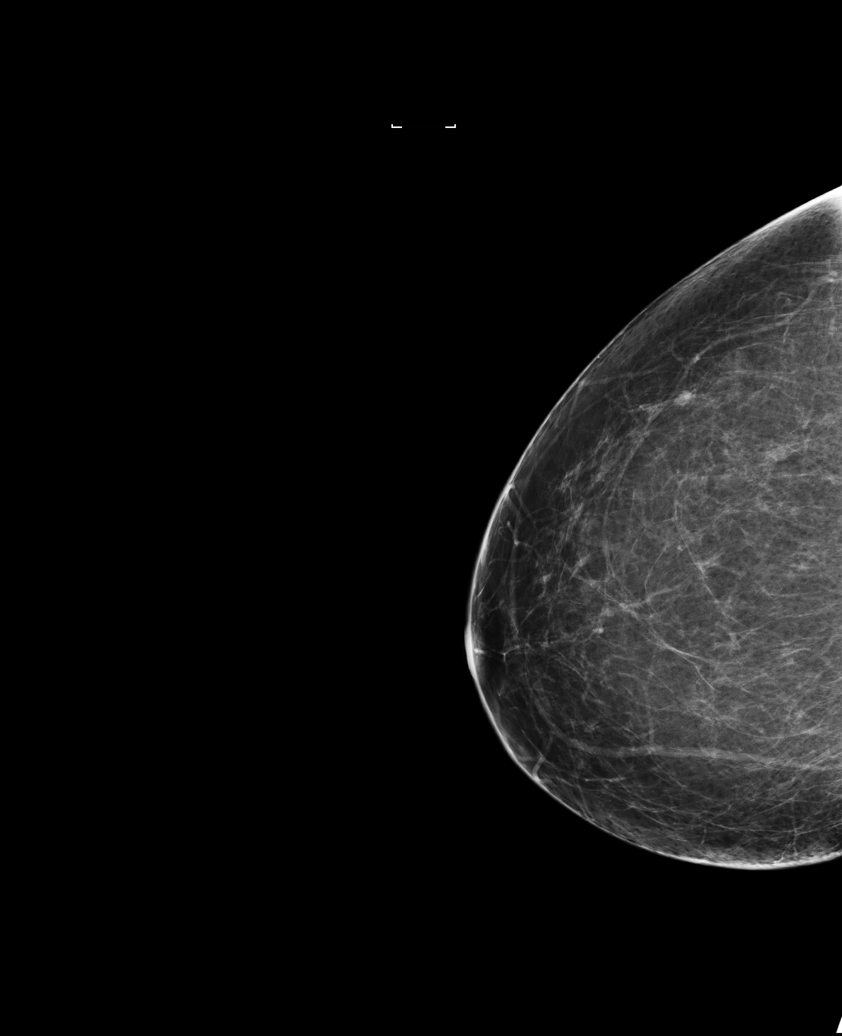

[6 of 21 positions shown; findings below may reference images not displayed]

ACR Breast Density Category b: There are scattered areas of
fibroglandular density.
FINDINGS: Approximately 5 mm circumscribed oval gently lobulated nodule is
confirmed in the upper-outer right breast. No architectural
distortion or additional mass is identified in the right breast.

Mammographic images were processed with CAD.

Targeted ultrasound is performed, showing a 4 x 3 x 6 mm oval mass
with internal anechoic areas consistent with fluid and a thin
internal echogenic septation. This mass is at 10 o'clock position 6
cm from the nipple, corresponding in size, shape, and position to
the mammographically detected mass. There is no associated vascular
flow.
IMPRESSION: 4 x 3 x 6 mm mildly complicated cyst with a thin internal septation
is identified at 10 o'clock position in the right breast. This has a
benign appearance.

RECOMMENDATION:
Screening mammogram in one year.(Code:SZ-C-RJ3)

I have discussed the findings and recommendations with the patient.
Results were also provided in writing at the conclusion of the
visit. If applicable, a reminder letter will be sent to the patient
regarding the next appointment.

BI-RADS CATEGORY  2: Benign.

## 2018-05-14 DIAGNOSIS — N8189 Other female genital prolapse: Secondary | ICD-10-CM | POA: Diagnosis not present

## 2018-05-14 DIAGNOSIS — Z01812 Encounter for preprocedural laboratory examination: Secondary | ICD-10-CM | POA: Diagnosis not present

## 2018-05-14 DIAGNOSIS — E039 Hypothyroidism, unspecified: Secondary | ICD-10-CM | POA: Diagnosis not present

## 2018-05-21 DIAGNOSIS — D259 Leiomyoma of uterus, unspecified: Secondary | ICD-10-CM | POA: Diagnosis not present

## 2018-05-21 DIAGNOSIS — N803 Endometriosis of pelvic peritoneum: Secondary | ICD-10-CM | POA: Diagnosis not present

## 2018-05-21 DIAGNOSIS — N8189 Other female genital prolapse: Secondary | ICD-10-CM | POA: Diagnosis not present

## 2018-06-06 DIAGNOSIS — L814 Other melanin hyperpigmentation: Secondary | ICD-10-CM | POA: Diagnosis not present

## 2018-06-06 DIAGNOSIS — L570 Actinic keratosis: Secondary | ICD-10-CM | POA: Diagnosis not present

## 2018-06-06 DIAGNOSIS — D225 Melanocytic nevi of trunk: Secondary | ICD-10-CM | POA: Diagnosis not present

## 2018-06-06 DIAGNOSIS — Z872 Personal history of diseases of the skin and subcutaneous tissue: Secondary | ICD-10-CM | POA: Diagnosis not present

## 2018-07-05 DIAGNOSIS — Z760 Encounter for issue of repeat prescription: Secondary | ICD-10-CM | POA: Diagnosis not present

## 2018-07-05 DIAGNOSIS — Z9109 Other allergy status, other than to drugs and biological substances: Secondary | ICD-10-CM | POA: Diagnosis not present

## 2018-10-11 DIAGNOSIS — Z760 Encounter for issue of repeat prescription: Secondary | ICD-10-CM | POA: Diagnosis not present

## 2018-10-11 DIAGNOSIS — Z9109 Other allergy status, other than to drugs and biological substances: Secondary | ICD-10-CM | POA: Diagnosis not present

## 2019-01-23 DIAGNOSIS — Z9109 Other allergy status, other than to drugs and biological substances: Secondary | ICD-10-CM | POA: Diagnosis not present

## 2019-01-23 DIAGNOSIS — Z760 Encounter for issue of repeat prescription: Secondary | ICD-10-CM | POA: Diagnosis not present

## 2019-03-29 ENCOUNTER — Other Ambulatory Visit: Payer: Self-pay | Admitting: *Deleted

## 2019-03-29 MED ORDER — METOPROLOL TARTRATE 25 MG PO TABS: 25.0000 mg | ORAL_TABLET | Freq: Two times a day (BID) | ORAL | 0 refills | Status: AC

## 2019-04-21 ENCOUNTER — Other Ambulatory Visit: Payer: Self-pay | Admitting: Cardiology

## 2019-04-29 DIAGNOSIS — E785 Hyperlipidemia, unspecified: Secondary | ICD-10-CM | POA: Diagnosis not present

## 2019-04-29 DIAGNOSIS — Z0000 Encounter for general adult medical examination without abnormal findings: Secondary | ICD-10-CM | POA: Diagnosis not present

## 2019-04-29 DIAGNOSIS — E039 Hypothyroidism, unspecified: Secondary | ICD-10-CM | POA: Diagnosis not present

## 2019-05-17 DIAGNOSIS — Z1231 Encounter for screening mammogram for malignant neoplasm of breast: Secondary | ICD-10-CM | POA: Diagnosis not present

## 2019-05-17 DIAGNOSIS — Z6830 Body mass index (BMI) 30.0-30.9, adult: Secondary | ICD-10-CM | POA: Diagnosis not present

## 2019-05-17 DIAGNOSIS — Z01419 Encounter for gynecological examination (general) (routine) without abnormal findings: Secondary | ICD-10-CM | POA: Diagnosis not present

## 2019-05-17 DIAGNOSIS — N952 Postmenopausal atrophic vaginitis: Secondary | ICD-10-CM | POA: Diagnosis not present

## 2019-05-27 DIAGNOSIS — E349 Endocrine disorder, unspecified: Secondary | ICD-10-CM | POA: Diagnosis not present

## 2019-05-27 DIAGNOSIS — Z79899 Other long term (current) drug therapy: Secondary | ICD-10-CM | POA: Diagnosis not present

## 2019-05-27 DIAGNOSIS — E7800 Pure hypercholesterolemia, unspecified: Secondary | ICD-10-CM | POA: Diagnosis not present

## 2019-05-27 DIAGNOSIS — Z1159 Encounter for screening for other viral diseases: Secondary | ICD-10-CM | POA: Diagnosis not present

## 2019-05-27 DIAGNOSIS — Z131 Encounter for screening for diabetes mellitus: Secondary | ICD-10-CM | POA: Diagnosis not present

## 2019-05-27 DIAGNOSIS — D539 Nutritional anemia, unspecified: Secondary | ICD-10-CM | POA: Diagnosis not present

## 2019-05-27 DIAGNOSIS — Z1331 Encounter for screening for depression: Secondary | ICD-10-CM | POA: Diagnosis not present

## 2019-05-27 DIAGNOSIS — R0602 Shortness of breath: Secondary | ICD-10-CM | POA: Diagnosis not present

## 2019-05-27 DIAGNOSIS — R5383 Other fatigue: Secondary | ICD-10-CM | POA: Diagnosis not present

## 2019-05-27 DIAGNOSIS — R635 Abnormal weight gain: Secondary | ICD-10-CM | POA: Diagnosis not present

## 2019-05-27 DIAGNOSIS — E8881 Metabolic syndrome: Secondary | ICD-10-CM | POA: Diagnosis not present

## 2019-05-27 DIAGNOSIS — E559 Vitamin D deficiency, unspecified: Secondary | ICD-10-CM | POA: Diagnosis not present

## 2019-06-12 DIAGNOSIS — L570 Actinic keratosis: Secondary | ICD-10-CM | POA: Diagnosis not present

## 2019-06-12 DIAGNOSIS — L718 Other rosacea: Secondary | ICD-10-CM | POA: Diagnosis not present

## 2019-06-12 DIAGNOSIS — L720 Epidermal cyst: Secondary | ICD-10-CM | POA: Diagnosis not present

## 2019-06-12 DIAGNOSIS — D225 Melanocytic nevi of trunk: Secondary | ICD-10-CM | POA: Diagnosis not present

## 2019-06-12 DIAGNOSIS — L578 Other skin changes due to chronic exposure to nonionizing radiation: Secondary | ICD-10-CM | POA: Diagnosis not present

## 2019-06-24 DIAGNOSIS — E559 Vitamin D deficiency, unspecified: Secondary | ICD-10-CM | POA: Diagnosis not present

## 2019-06-24 DIAGNOSIS — R7303 Prediabetes: Secondary | ICD-10-CM | POA: Diagnosis not present

## 2019-06-24 DIAGNOSIS — E8881 Metabolic syndrome: Secondary | ICD-10-CM | POA: Diagnosis not present

## 2019-06-24 DIAGNOSIS — R635 Abnormal weight gain: Secondary | ICD-10-CM | POA: Diagnosis not present

## 2019-07-01 DIAGNOSIS — R945 Abnormal results of liver function studies: Secondary | ICD-10-CM | POA: Diagnosis not present

## 2019-09-10 DIAGNOSIS — E039 Hypothyroidism, unspecified: Secondary | ICD-10-CM | POA: Diagnosis not present

## 2020-02-03 DIAGNOSIS — L820 Inflamed seborrheic keratosis: Secondary | ICD-10-CM | POA: Diagnosis not present

## 2020-02-03 DIAGNOSIS — L570 Actinic keratosis: Secondary | ICD-10-CM | POA: Diagnosis not present

## 2020-02-21 DIAGNOSIS — Z20822 Contact with and (suspected) exposure to covid-19: Secondary | ICD-10-CM | POA: Diagnosis not present

## 2020-05-11 DIAGNOSIS — R002 Palpitations: Secondary | ICD-10-CM | POA: Diagnosis not present

## 2020-05-18 DIAGNOSIS — Z6828 Body mass index (BMI) 28.0-28.9, adult: Secondary | ICD-10-CM | POA: Diagnosis not present

## 2020-05-18 DIAGNOSIS — Z01419 Encounter for gynecological examination (general) (routine) without abnormal findings: Secondary | ICD-10-CM | POA: Diagnosis not present

## 2020-05-18 DIAGNOSIS — Z1231 Encounter for screening mammogram for malignant neoplasm of breast: Secondary | ICD-10-CM | POA: Diagnosis not present

## 2020-05-18 DIAGNOSIS — Z13820 Encounter for screening for osteoporosis: Secondary | ICD-10-CM | POA: Diagnosis not present

## 2020-06-10 DIAGNOSIS — L720 Epidermal cyst: Secondary | ICD-10-CM | POA: Diagnosis not present

## 2020-06-10 DIAGNOSIS — L578 Other skin changes due to chronic exposure to nonionizing radiation: Secondary | ICD-10-CM | POA: Diagnosis not present

## 2020-06-10 DIAGNOSIS — L718 Other rosacea: Secondary | ICD-10-CM | POA: Diagnosis not present

## 2020-06-10 DIAGNOSIS — L570 Actinic keratosis: Secondary | ICD-10-CM | POA: Diagnosis not present

## 2021-05-11 DIAGNOSIS — K641 Second degree hemorrhoids: Secondary | ICD-10-CM | POA: Diagnosis not present

## 2021-05-11 DIAGNOSIS — Z1211 Encounter for screening for malignant neoplasm of colon: Secondary | ICD-10-CM | POA: Diagnosis not present

## 2021-05-11 DIAGNOSIS — E669 Obesity, unspecified: Secondary | ICD-10-CM | POA: Diagnosis not present

## 2021-05-11 DIAGNOSIS — Z86010 Personal history of colonic polyps: Secondary | ICD-10-CM | POA: Diagnosis not present

## 2021-05-31 DIAGNOSIS — R002 Palpitations: Secondary | ICD-10-CM | POA: Diagnosis not present

## 2021-06-07 DIAGNOSIS — Z1231 Encounter for screening mammogram for malignant neoplasm of breast: Secondary | ICD-10-CM | POA: Diagnosis not present

## 2021-06-07 DIAGNOSIS — Z01419 Encounter for gynecological examination (general) (routine) without abnormal findings: Secondary | ICD-10-CM | POA: Diagnosis not present

## 2021-06-07 DIAGNOSIS — Z6830 Body mass index (BMI) 30.0-30.9, adult: Secondary | ICD-10-CM | POA: Diagnosis not present

## 2021-06-21 DIAGNOSIS — L718 Other rosacea: Secondary | ICD-10-CM | POA: Diagnosis not present

## 2021-06-21 DIAGNOSIS — L578 Other skin changes due to chronic exposure to nonionizing radiation: Secondary | ICD-10-CM | POA: Diagnosis not present

## 2021-06-21 DIAGNOSIS — D1801 Hemangioma of skin and subcutaneous tissue: Secondary | ICD-10-CM | POA: Diagnosis not present

## 2021-06-21 DIAGNOSIS — L570 Actinic keratosis: Secondary | ICD-10-CM | POA: Diagnosis not present

## 2021-07-05 DIAGNOSIS — K5730 Diverticulosis of large intestine without perforation or abscess without bleeding: Secondary | ICD-10-CM | POA: Diagnosis not present

## 2021-07-05 DIAGNOSIS — Z1211 Encounter for screening for malignant neoplasm of colon: Secondary | ICD-10-CM | POA: Diagnosis not present

## 2021-07-05 DIAGNOSIS — K649 Unspecified hemorrhoids: Secondary | ICD-10-CM | POA: Diagnosis not present

## 2021-11-30 DIAGNOSIS — E039 Hypothyroidism, unspecified: Secondary | ICD-10-CM | POA: Diagnosis not present

## 2022-05-24 ENCOUNTER — Other Ambulatory Visit: Payer: Self-pay | Admitting: Obstetrics and Gynecology

## 2022-05-24 DIAGNOSIS — R928 Other abnormal and inconclusive findings on diagnostic imaging of breast: Secondary | ICD-10-CM

## 2022-06-03 ENCOUNTER — Ambulatory Visit: Payer: BLUE CROSS/BLUE SHIELD

## 2022-06-03 ENCOUNTER — Other Ambulatory Visit: Payer: Self-pay | Admitting: Obstetrics and Gynecology

## 2022-06-03 ENCOUNTER — Ambulatory Visit
Admission: RE | Admit: 2022-06-03 | Discharge: 2022-06-03 | Disposition: A | Discharge status: Home / Self Care | Payer: No Typology Code available for payment source | Source: Ambulatory Visit | Attending: Obstetrics and Gynecology | Admitting: Obstetrics and Gynecology

## 2022-06-03 DIAGNOSIS — N6489 Other specified disorders of breast: Secondary | ICD-10-CM

## 2022-06-03 DIAGNOSIS — R928 Other abnormal and inconclusive findings on diagnostic imaging of breast: Secondary | ICD-10-CM

## 2022-06-27 DIAGNOSIS — L578 Other skin changes due to chronic exposure to nonionizing radiation: Secondary | ICD-10-CM | POA: Diagnosis not present

## 2022-06-27 DIAGNOSIS — L718 Other rosacea: Secondary | ICD-10-CM | POA: Diagnosis not present

## 2022-06-27 DIAGNOSIS — D225 Melanocytic nevi of trunk: Secondary | ICD-10-CM | POA: Diagnosis not present

## 2022-06-27 DIAGNOSIS — D1801 Hemangioma of skin and subcutaneous tissue: Secondary | ICD-10-CM | POA: Diagnosis not present

## 2022-06-27 DIAGNOSIS — L570 Actinic keratosis: Secondary | ICD-10-CM | POA: Diagnosis not present

## 2022-07-20 DIAGNOSIS — Z6829 Body mass index (BMI) 29.0-29.9, adult: Secondary | ICD-10-CM | POA: Diagnosis not present

## 2022-07-20 DIAGNOSIS — Z01419 Encounter for gynecological examination (general) (routine) without abnormal findings: Secondary | ICD-10-CM | POA: Diagnosis not present

## 2022-07-20 DIAGNOSIS — N952 Postmenopausal atrophic vaginitis: Secondary | ICD-10-CM | POA: Diagnosis not present

## 2022-10-10 DIAGNOSIS — E039 Hypothyroidism, unspecified: Secondary | ICD-10-CM | POA: Diagnosis not present

## 2022-10-10 DIAGNOSIS — R201 Hypoesthesia of skin: Secondary | ICD-10-CM | POA: Diagnosis not present

## 2022-10-10 DIAGNOSIS — Z87898 Personal history of other specified conditions: Secondary | ICD-10-CM | POA: Diagnosis not present

## 2023-01-06 ENCOUNTER — Encounter: Payer: Self-pay | Admitting: Diagnostic Neuroimaging

## 2023-01-06 ENCOUNTER — Ambulatory Visit: Payer: PPO | Admitting: Diagnostic Neuroimaging

## 2023-01-06 VITALS — BP 127/80 | HR 72 | Ht 63.0 in | Wt 167.0 lb

## 2023-01-06 DIAGNOSIS — M79651 Pain in right thigh: Secondary | ICD-10-CM | POA: Diagnosis not present

## 2023-01-06 DIAGNOSIS — M79652 Pain in left thigh: Secondary | ICD-10-CM

## 2023-01-06 NOTE — Progress Notes (Signed)
[facility]  PATIENT: Cheryl Moss DOB: [birth year]  REFERRING CLINICIAN: Koren Shiver, DO HISTORY FROM: patient  REASON FOR VISIT: new consult   HISTORICAL  CHIEF COMPLAINT:  Chief Complaint  Patient presents with   New Patient (Initial Visit)    Pt in 6 Pt here for Neuropathy  Pt states burning and tingling in both thighs Pt states Pain was bad in July 2024 Pt states at visit today symptoms have subsided     HISTORY OF PRESENT ILLNESS:   67 year old female here for evaluation of burning, stinging sensation of the thighs.  Symptoms started in March 2024.  She felt discomfort in the left anterolateral thigh greater than the right side.  This progressively worsened until June 2024.  She saw PCP and was referred here for evaluation.  Since that time symptoms have spontaneously reduced.  Of note patient had recently retired in February 2024.  She had a fairly sedentary job at the time working at [employer].  Since retirement she has continued to stay fairly sedentary but is looking forward to starting exercise program soon once her husband recovers from his foot surgery.  Some minimal low back discomfort.  No radiating symptoms.  No change in weight.  No other triggering or aggravating factors.  No problems with feet or toes.  Does have some mild "sensitivity" in the ankles and shins that is longstanding and uncomfortable when she gets pedicures or massages in the legs.   REVIEW OF SYSTEMS: Full 14 system review of systems performed and negative with exception of: as per HPI.  ALLERGIES: No Known Allergies  HOME MEDICATIONS: Outpatient Medications Prior to Visit  Medication Sig Dispense Refill   levothyroxine (SYNTHROID, LEVOTHROID) 50 MCG tablet Take 75 mcg by mouth daily.  10   metoprolol tartrate (LOPRESSOR) 25 MG tablet Take 1 tablet (25 mg total) by mouth 2 (two) times daily. FUTURE REFILLS WITH PCP 60 tablet 0   Multiple Vitamin (MULTIVITAMIN PO) Take by  mouth.     Azelastine HCl 0.15 % SOLN Place 2 sprays into the nose daily. 30 mL 5   loratadine (CLARITIN) 10 MG tablet Take 10 mg by mouth daily.     No facility-administered medications prior to visit.    PAST MEDICAL HISTORY: Past Medical History:  Diagnosis Date   Hypothyroid     PAST SURGICAL HISTORY: Past Surgical History:  Procedure Laterality Date   CHOLECYSTECTOMY      FAMILY HISTORY: Family History  Problem Relation Age of Onset   Heart disease Mother        Pacemaker   Heart attack Father 75       Died age 42   Allergic rhinitis Neg Hx    Angioedema Neg Hx    Asthma Neg Hx    Eczema Neg Hx    Neuropathy Neg Hx     SOCIAL HISTORY: Social History   Socioeconomic History   Marital status: Married    Spouse name: Not on file   Number of children: 2   Years of education: Not on file   Highest education level: Not on file  Occupational History   Not on file  Tobacco Use   Smoking status: Never   Smokeless tobacco: Never  Substance and Sexual Activity   Alcohol use: Not on file   Drug use: Not on file   Sexual activity: Not on file  Other Topics Concern   Not on file  Social History Narrative   Lives  with husband.  Works at [employer]: Not on [employer] Insecurity: Not on file  Transportation Needs: Not on file  Physical Activity: Not on file  Stress: Not on file (05/21/2019)  Social Connections: Not on file  Intimate Partner Violence: Low Risk  (07/26/2019)   Received [facility]   Intimate Partner Violence    Insults You: Not on file    Threatens You: Not on file    Screams at [place]: Not on file    Physically Hurt: Not on file    Intimate Partner Violence Score: Not on file     PHYSICAL EXAM  GENERAL EXAM/CONSTITUTIONAL: Vitals:  Vitals:   01/06/23 1013  BP: 127/80  Pulse: 72  Weight: 167 lb (75.8 kg)  Height: 5\' 3"  (1.6 m)   Body mass index is 29.58  kg/m. Wt Readings from Last 3 Encounters:  01/06/23 167 lb (75.8 kg)  08/17/16 174 lb (78.9 kg)  08/10/16 176 lb 3.2 oz (79.9 kg)   Patient is in no distress; well developed, nourished and groomed; neck is supple  CARDIOVASCULAR: Examination of carotid arteries is normal; no carotid bruits Regular rate and rhythm, no murmurs Examination of peripheral vascular system by observation and palpation is normal  EYES: Ophthalmoscopic exam of optic discs and posterior segments is normal; no papilledema or hemorrhages No results found.  MUSCULOSKELETAL: Gait, strength, tone, movements noted in Neurologic exam below  NEUROLOGIC: MENTAL STATUS:      No data to display         awake, alert, oriented to person, place and time recent and remote memory intact normal attention and concentration language fluent, comprehension intact, naming intact fund of knowledge appropriate  CRANIAL NERVE:  2nd - no papilledema on fundoscopic exam 2nd, 3rd, 4th, 6th - pupils equal and reactive to light, visual fields full to confrontation, extraocular muscles intact, no nystagmus 5th - facial sensation symmetric 7th - facial strength symmetric 8th - hearing intact 9th - palate elevates symmetrically, uvula midline 11th - shoulder shrug symmetric 12th - tongue protrusion midline  MOTOR:  normal bulk and tone, full strength in the BUE, BLE  SENSORY:  normal and symmetric to light touch, temperature, vibration  COORDINATION:  finger-nose-finger, fine finger movements normal  REFLEXES:  deep tendon reflexes present and symmetric  GAIT/STATION:  narrow based gait     DIAGNOSTIC DATA (LABS, IMAGING, TESTING) - I reviewed patient records, labs, notes, testing and imaging myself where available.  No results found for: "WBC", "HGB", "HCT", "MCV", "PLT" No results found for: "NA", "K", "CL", "CO2", "GLUCOSE", "BUN", "CREATININE", "CALCIUM", "PROT", "ALBUMIN", "AST", "ALT", "ALKPHOS",  "BILITOT", "GFRNONAA", "GFRAA" No results found for: "CHOL", "HDL", "LDLCALC", "LDLDIRECT", "TRIG", "CHOLHDL" No results found for: "HGBA1C" No results found for: "VITAMINB12" No results found for: "TSH"   ASSESSMENT AND PLAN  67 y.o. year old female here with:   Dx:  1. Pain in both thighs     PLAN:  BILATERAL THIGH BURNING / PAIN (likely meralgia paresthetica; left worse than right) - now improved / resolved; encouraged to start gently stretching / exercise routine  Return for pending if symptoms worsen or fail to improve, return to PCP.    Suanne Marker, MD 01/06/2023, 10:56 AM Certified in Neurology, Neurophysiology and Neuroimaging  [facility] [address], [address] [place], [place] [address] [phone]

## 2023-01-11 DIAGNOSIS — M25512 Pain in left shoulder: Secondary | ICD-10-CM | POA: Diagnosis not present

## 2023-02-13 DIAGNOSIS — J069 Acute upper respiratory infection, unspecified: Secondary | ICD-10-CM | POA: Diagnosis not present

## 2023-03-29 DIAGNOSIS — M25512 Pain in left shoulder: Secondary | ICD-10-CM | POA: Diagnosis not present

## 2023-04-03 DIAGNOSIS — M25512 Pain in left shoulder: Secondary | ICD-10-CM | POA: Diagnosis not present

## 2023-05-22 DIAGNOSIS — Z1159 Encounter for screening for other viral diseases: Secondary | ICD-10-CM | POA: Diagnosis not present

## 2023-06-19 DIAGNOSIS — M94262 Chondromalacia, left knee: Secondary | ICD-10-CM | POA: Diagnosis not present

## 2023-06-19 DIAGNOSIS — S43432A Superior glenoid labrum lesion of left shoulder, initial encounter: Secondary | ICD-10-CM | POA: Diagnosis not present

## 2023-06-19 DIAGNOSIS — S46012A Strain of muscle(s) and tendon(s) of the rotator cuff of left shoulder, initial encounter: Secondary | ICD-10-CM | POA: Diagnosis not present

## 2023-06-19 DIAGNOSIS — S4382XA Sprain of other specified parts of left shoulder girdle, initial encounter: Secondary | ICD-10-CM | POA: Diagnosis not present

## 2023-06-19 DIAGNOSIS — M7552 Bursitis of left shoulder: Secondary | ICD-10-CM | POA: Diagnosis not present

## 2023-06-19 DIAGNOSIS — M94212 Chondromalacia, left shoulder: Secondary | ICD-10-CM | POA: Diagnosis not present

## 2023-06-19 DIAGNOSIS — M7542 Impingement syndrome of left shoulder: Secondary | ICD-10-CM | POA: Diagnosis not present

## 2023-06-19 DIAGNOSIS — Y999 Unspecified external cause status: Secondary | ICD-10-CM | POA: Diagnosis not present

## 2023-06-19 DIAGNOSIS — X58XXXA Exposure to other specified factors, initial encounter: Secondary | ICD-10-CM | POA: Diagnosis not present

## 2023-06-19 DIAGNOSIS — G8918 Other acute postprocedural pain: Secondary | ICD-10-CM | POA: Diagnosis not present

## 2023-07-24 DIAGNOSIS — M25512 Pain in left shoulder: Secondary | ICD-10-CM | POA: Diagnosis not present

## 2023-07-24 DIAGNOSIS — M25612 Stiffness of left shoulder, not elsewhere classified: Secondary | ICD-10-CM | POA: Diagnosis not present

## 2023-07-24 DIAGNOSIS — M6281 Muscle weakness (generalized): Secondary | ICD-10-CM | POA: Diagnosis not present

## 2023-07-25 DIAGNOSIS — M6281 Muscle weakness (generalized): Secondary | ICD-10-CM | POA: Diagnosis not present

## 2023-07-25 DIAGNOSIS — M25512 Pain in left shoulder: Secondary | ICD-10-CM | POA: Diagnosis not present

## 2023-07-25 DIAGNOSIS — M25612 Stiffness of left shoulder, not elsewhere classified: Secondary | ICD-10-CM | POA: Diagnosis not present

## 2023-08-03 DIAGNOSIS — M25512 Pain in left shoulder: Secondary | ICD-10-CM | POA: Diagnosis not present

## 2023-08-03 DIAGNOSIS — M25612 Stiffness of left shoulder, not elsewhere classified: Secondary | ICD-10-CM | POA: Diagnosis not present

## 2023-08-03 DIAGNOSIS — M6281 Muscle weakness (generalized): Secondary | ICD-10-CM | POA: Diagnosis not present

## 2023-08-07 DIAGNOSIS — M6281 Muscle weakness (generalized): Secondary | ICD-10-CM | POA: Diagnosis not present

## 2023-08-07 DIAGNOSIS — M25612 Stiffness of left shoulder, not elsewhere classified: Secondary | ICD-10-CM | POA: Diagnosis not present

## 2023-08-07 DIAGNOSIS — M25512 Pain in left shoulder: Secondary | ICD-10-CM | POA: Diagnosis not present

## 2023-08-09 DIAGNOSIS — M25512 Pain in left shoulder: Secondary | ICD-10-CM | POA: Diagnosis not present

## 2023-08-09 DIAGNOSIS — M6281 Muscle weakness (generalized): Secondary | ICD-10-CM | POA: Diagnosis not present

## 2023-08-09 DIAGNOSIS — M25612 Stiffness of left shoulder, not elsewhere classified: Secondary | ICD-10-CM | POA: Diagnosis not present

## 2023-08-09 DIAGNOSIS — H43811 Vitreous degeneration, right eye: Secondary | ICD-10-CM | POA: Diagnosis not present

## 2023-08-14 DIAGNOSIS — M25512 Pain in left shoulder: Secondary | ICD-10-CM | POA: Diagnosis not present

## 2023-08-14 DIAGNOSIS — M6281 Muscle weakness (generalized): Secondary | ICD-10-CM | POA: Diagnosis not present

## 2023-08-14 DIAGNOSIS — M25612 Stiffness of left shoulder, not elsewhere classified: Secondary | ICD-10-CM | POA: Diagnosis not present

## 2023-08-21 DIAGNOSIS — M6281 Muscle weakness (generalized): Secondary | ICD-10-CM | POA: Diagnosis not present

## 2023-08-21 DIAGNOSIS — M25512 Pain in left shoulder: Secondary | ICD-10-CM | POA: Diagnosis not present

## 2023-08-21 DIAGNOSIS — M25612 Stiffness of left shoulder, not elsewhere classified: Secondary | ICD-10-CM | POA: Diagnosis not present

## 2023-09-11 DIAGNOSIS — H43811 Vitreous degeneration, right eye: Secondary | ICD-10-CM | POA: Diagnosis not present

## 2023-09-20 ENCOUNTER — Other Ambulatory Visit: Payer: Self-pay | Admitting: Obstetrics and Gynecology

## 2023-09-20 DIAGNOSIS — L578 Other skin changes due to chronic exposure to nonionizing radiation: Secondary | ICD-10-CM | POA: Diagnosis not present

## 2023-09-20 DIAGNOSIS — L718 Other rosacea: Secondary | ICD-10-CM | POA: Diagnosis not present

## 2023-09-20 DIAGNOSIS — L570 Actinic keratosis: Secondary | ICD-10-CM | POA: Diagnosis not present

## 2023-09-20 DIAGNOSIS — D225 Melanocytic nevi of trunk: Secondary | ICD-10-CM | POA: Diagnosis not present

## 2023-09-20 DIAGNOSIS — N6489 Other specified disorders of breast: Secondary | ICD-10-CM

## 2023-09-20 DIAGNOSIS — W908XXD Exposure to other nonionizing radiation, subsequent encounter: Secondary | ICD-10-CM | POA: Diagnosis not present

## 2023-09-21 DIAGNOSIS — N952 Postmenopausal atrophic vaginitis: Secondary | ICD-10-CM | POA: Diagnosis not present

## 2023-09-21 DIAGNOSIS — Z1272 Encounter for screening for malignant neoplasm of vagina: Secondary | ICD-10-CM | POA: Diagnosis not present

## 2023-09-21 DIAGNOSIS — Z124 Encounter for screening for malignant neoplasm of cervix: Secondary | ICD-10-CM | POA: Diagnosis not present

## 2023-09-21 DIAGNOSIS — Z6830 Body mass index (BMI) 30.0-30.9, adult: Secondary | ICD-10-CM | POA: Diagnosis not present

## 2023-12-21 DIAGNOSIS — R42 Dizziness and giddiness: Secondary | ICD-10-CM | POA: Diagnosis not present

## 2023-12-21 DIAGNOSIS — R400 Somnolence: Secondary | ICD-10-CM | POA: Diagnosis not present

## 2023-12-21 DIAGNOSIS — Z6831 Body mass index (BMI) 31.0-31.9, adult: Secondary | ICD-10-CM | POA: Diagnosis not present

## 2024-03-11 DIAGNOSIS — H2513 Age-related nuclear cataract, bilateral: Secondary | ICD-10-CM | POA: Diagnosis not present

## 2024-05-01 ENCOUNTER — Ambulatory Visit
Admission: RE | Admit: 2024-05-01 | Discharge: 2024-05-01 | Disposition: A | Source: Ambulatory Visit | Attending: Obstetrics and Gynecology | Admitting: Obstetrics and Gynecology

## 2024-05-01 ENCOUNTER — Other Ambulatory Visit: Payer: Self-pay | Admitting: Obstetrics and Gynecology

## 2024-05-01 DIAGNOSIS — N6489 Other specified disorders of breast: Secondary | ICD-10-CM
